# Patient Record
Sex: Male | Born: 1965 | Race: Black or African American | Hispanic: No | Marital: Married | Smoking: Never smoker
Health system: Southern US, Community
[De-identification: ages and names within clinical notes are randomized; demographics above are authoritative.]

---

## 2002-11-30 ENCOUNTER — Emergency Department (HOSPITAL_COMMUNITY): Admission: AD | Admit: 2002-11-30 | Discharge: 2002-11-30 | Payer: Self-pay | Admitting: Family Medicine

## 2003-09-11 ENCOUNTER — Emergency Department (HOSPITAL_COMMUNITY): Admission: EM | Admit: 2003-09-11 | Discharge: 2003-09-12 | Payer: Self-pay | Admitting: Emergency Medicine

## 2003-09-18 ENCOUNTER — Emergency Department (HOSPITAL_COMMUNITY): Admission: EM | Admit: 2003-09-18 | Discharge: 2003-09-18 | Payer: Self-pay | Admitting: Family Medicine

## 2005-01-17 ENCOUNTER — Emergency Department (HOSPITAL_COMMUNITY): Admission: EM | Admit: 2005-01-17 | Discharge: 2005-01-17 | Payer: Self-pay | Admitting: Emergency Medicine

## 2005-10-23 ENCOUNTER — Encounter: Admission: RE | Admit: 2005-10-23 | Discharge: 2005-10-23 | Payer: Self-pay | Admitting: Family Medicine

## 2006-04-02 ENCOUNTER — Encounter: Admission: RE | Admit: 2006-04-02 | Discharge: 2006-04-02 | Payer: Self-pay | Admitting: Family Medicine

## 2006-04-14 ENCOUNTER — Encounter: Admission: RE | Admit: 2006-04-14 | Discharge: 2006-04-14 | Payer: Self-pay | Admitting: Family Medicine

## 2007-02-13 ENCOUNTER — Emergency Department (HOSPITAL_COMMUNITY): Admission: EM | Admit: 2007-02-13 | Discharge: 2007-02-13 | Payer: Self-pay | Admitting: Emergency Medicine

## 2007-05-24 ENCOUNTER — Emergency Department (HOSPITAL_COMMUNITY): Admission: EM | Admit: 2007-05-24 | Discharge: 2007-05-24 | Payer: Self-pay | Admitting: Family Medicine

## 2008-08-02 ENCOUNTER — Emergency Department (HOSPITAL_COMMUNITY): Admission: EM | Admit: 2008-08-02 | Discharge: 2008-08-02 | Payer: Self-pay | Admitting: Family Medicine

## 2008-12-15 ENCOUNTER — Emergency Department (HOSPITAL_COMMUNITY): Admission: EM | Admit: 2008-12-15 | Discharge: 2008-12-15 | Payer: Self-pay | Admitting: Family Medicine

## 2009-11-10 ENCOUNTER — Emergency Department (HOSPITAL_COMMUNITY): Admission: EM | Admit: 2009-11-10 | Discharge: 2009-11-10 | Payer: Self-pay | Admitting: Family Medicine

## 2010-01-22 ENCOUNTER — Encounter: Payer: Self-pay | Admitting: Family Medicine

## 2010-03-14 LAB — POCT URINALYSIS DIPSTICK
Glucose, UA: NEGATIVE mg/dL
Ketones, ur: NEGATIVE mg/dL
Specific Gravity, Urine: 1.01 (ref 1.005–1.030)
Urobilinogen, UA: 0.2 mg/dL (ref 0.0–1.0)

## 2010-04-08 LAB — GC/CHLAMYDIA PROBE AMP, GENITAL: Chlamydia, DNA Probe: NEGATIVE

## 2010-04-08 LAB — HSV 1 ANTIBODY, IGG: HSV 1 Glycoprotein G Ab, IgG: 43.7 IV — ABNORMAL HIGH

## 2010-04-08 LAB — HIV ANTIBODY (ROUTINE TESTING W REFLEX): HIV: NONREACTIVE

## 2010-06-28 ENCOUNTER — Inpatient Hospital Stay (INDEPENDENT_AMBULATORY_CARE_PROVIDER_SITE_OTHER)
Admission: RE | Admit: 2010-06-28 | Discharge: 2010-06-28 | Disposition: A | Discharge status: Home / Self Care | Payer: Self-pay | Source: Ambulatory Visit | Attending: Family Medicine | Admitting: Family Medicine

## 2010-06-28 DIAGNOSIS — R300 Dysuria: Secondary | ICD-10-CM

## 2010-06-28 LAB — POCT URINALYSIS DIP (DEVICE)
Ketones, ur: NEGATIVE mg/dL
Leukocytes, UA: NEGATIVE
Protein, ur: NEGATIVE mg/dL
pH: 7 (ref 5.0–8.0)

## 2010-07-01 ENCOUNTER — Inpatient Hospital Stay (INDEPENDENT_AMBULATORY_CARE_PROVIDER_SITE_OTHER)
Admission: RE | Admit: 2010-07-01 | Discharge: 2010-07-01 | Disposition: A | Discharge status: Home / Self Care | Payer: Self-pay | Source: Ambulatory Visit | Attending: Family Medicine | Admitting: Family Medicine

## 2010-07-01 DIAGNOSIS — R071 Chest pain on breathing: Secondary | ICD-10-CM

## 2010-07-01 DIAGNOSIS — R300 Dysuria: Secondary | ICD-10-CM

## 2010-09-22 LAB — URINALYSIS, ROUTINE W REFLEX MICROSCOPIC
Glucose, UA: NEGATIVE
Hgb urine dipstick: NEGATIVE
Specific Gravity, Urine: 1.011
pH: 7

## 2010-09-22 LAB — DIFFERENTIAL
Basophils Absolute: 0
Eosinophils Absolute: 0
Eosinophils Relative: 0
Monocytes Absolute: 0.9

## 2010-09-22 LAB — I-STAT 8, (EC8 V) (CONVERTED LAB)
BUN: 6
Bicarbonate: 28.6 — ABNORMAL HIGH
Glucose, Bld: 105 — ABNORMAL HIGH
Potassium: 3.9
TCO2: 30
pH, Ven: 7.535 — ABNORMAL HIGH

## 2010-09-22 LAB — URINE CULTURE
Colony Count: NO GROWTH
Culture: NO GROWTH

## 2010-09-22 LAB — CBC
HCT: 45.2
MCV: 91.1
Platelets: 168
RDW: 12.6

## 2010-09-22 LAB — POCT I-STAT CREATININE: Operator id: 288331

## 2011-02-03 ENCOUNTER — Encounter (HOSPITAL_COMMUNITY): Payer: Self-pay | Admitting: Emergency Medicine

## 2011-02-03 ENCOUNTER — Emergency Department (INDEPENDENT_AMBULATORY_CARE_PROVIDER_SITE_OTHER)
Admission: EM | Admit: 2011-02-03 | Discharge: 2011-02-03 | Disposition: A | Discharge status: Home / Self Care | Payer: Self-pay | Source: Home / Self Care

## 2011-02-03 DIAGNOSIS — N342 Other urethritis: Secondary | ICD-10-CM

## 2011-02-03 MED ORDER — AZITHROMYCIN 250 MG PO TABS
1000.0000 mg | ORAL_TABLET | Freq: Once | ORAL | Status: AC
  Administered 2011-02-03: 1000 mg via ORAL

## 2011-02-03 MED ORDER — AZITHROMYCIN 250 MG PO TABS
ORAL_TABLET | ORAL | Status: AC
  Filled 2011-02-03: qty 4

## 2011-02-03 MED ORDER — CEFTRIAXONE SODIUM 250 MG IJ SOLR
250.0000 mg | Freq: Once | INTRAMUSCULAR | Status: AC
  Administered 2011-02-03: 250 mg via INTRAMUSCULAR

## 2011-02-03 MED ORDER — CEFTRIAXONE SODIUM 250 MG IJ SOLR
INTRAMUSCULAR | Status: AC
Start: 2011-02-03 — End: 2011-02-03
  Filled 2011-02-03: qty 250

## 2011-02-03 MED ORDER — LIDOCAINE HCL (PF) 1 % IJ SOLN
INTRAMUSCULAR | Status: AC
  Filled 2011-02-03: qty 5

## 2011-02-03 NOTE — ED Notes (Signed)
Verified  [phone] phone number

## 2011-02-03 NOTE — ED Notes (Signed)
Noticed penile tingling sensation 4 days ago, denies penile discharge.

## 2011-02-03 NOTE — ED Notes (Signed)
Offered graham crackers and coke while waiting for discharge.  Understands post injection delay prior to discharge

## 2011-02-03 NOTE — ED Provider Notes (Signed)
History     CSN: [id number]  Arrival date & time 02/03/11  1140   None     Chief Complaint  Patient presents with  . Penile Discharge    (Consider location/radiation/quality/duration/timing/severity/associated sxs/prior treatment) HPI Comments: Pt states he has burning discomfort with urination for 4 days. He had a "one night stand" recently and states the condom broke. He denies penile discharge.    History reviewed. No pertinent past medical history.  History reviewed. No pertinent past surgical history.  History reviewed. No pertinent family history.  History  Substance Use Topics  . Smoking status: Never Smoker   . Smokeless tobacco: Not on file  . Alcohol Use: No      Review of Systems  Constitutional: Negative for fever and chills.  Gastrointestinal: Negative for nausea, vomiting and abdominal pain.  Genitourinary: Positive for dysuria. Negative for frequency, discharge, penile swelling, scrotal swelling, penile pain and testicular pain.    Allergies  Review of patient's allergies indicates no known allergies.  Home Medications  No current outpatient prescriptions on file.  BP 128/80  Pulse 57  Temp(Src) 98 F (36.7 C) (Oral)  Resp 16  SpO2 99%  Physical Exam  Nursing note and vitals reviewed. Constitutional: He appears well-developed and well-nourished. No distress.  Cardiovascular: Normal rate, regular rhythm and normal heart sounds.   Pulmonary/Chest: Effort normal and breath sounds normal. No respiratory distress.  Genitourinary: Penis normal. Circumcised. No penile erythema. No discharge found.  Lymphadenopathy:       Right: No inguinal adenopathy present.       Left: No inguinal adenopathy present.  Skin: Skin is warm and dry.  Psychiatric: He has a normal mood and affect.    ED Course  Procedures (including critical care time)   Labs Reviewed  GC/CHLAMYDIA PROBE AMP, GENITAL   No results found.   1. Urethritis       MDM          Melody Comas, PA 02/03/11 1310

## 2011-02-04 ENCOUNTER — Telehealth (HOSPITAL_COMMUNITY): Payer: Self-pay | Admitting: Physician Assistant

## 2011-02-04 NOTE — ED Provider Notes (Signed)
Medical screening examination/treatment/procedure(s) were performed by non-physician practitioner and as supervising physician I was immediately available for consultation/collaboration.   MORENO-COLL,Gurnie Duris; MD   Anan Dapolito Moreno-Coll, MD 02/04/11 1242 

## 2011-02-04 NOTE — ED Notes (Signed)
Patient called today stating that he has had increased bowel movements since receiving medication since yesterday.  Patient made aware that the increase in bowel movements is normal given the amount of antibiotic he was given.  Patient made aware if it becomes loose or watery to taken immodium OTC.  Patient also made aware that the tingling in his penis would taken a couple of days to go away.  Pt expressed understanding.  While I was looking in patient's order history for the medications given I noticed order for [facility] had been discontinued.  I looked in lab system and the specimen was received in the lab.  I called the lab and spoke with [facility] and she said the specimen had been sent to [facility] [employer] and should show up when the results are finalized.  Provider made aware

## 2011-02-05 LAB — GC/CHLAMYDIA PROBE AMP, GENITAL: Chlamydia, DNA Probe: NEGATIVE

## 2011-02-06 ENCOUNTER — Telehealth (HOSPITAL_COMMUNITY): Payer: Self-pay | Admitting: *Deleted

## 2011-02-06 NOTE — ED Notes (Signed)
Discussed pt.'s concerns with Dr. Juanetta Gosling and order obtained for Flagyl 500 mg. PO BID x 7 days #14- No refills.  Pt. notified and wants Rx. called to [facility] on [address]. Rx. called to pharmacist @ [phone]. Vassie Moselle 02/06/2011

## 2011-06-15 ENCOUNTER — Encounter (HOSPITAL_BASED_OUTPATIENT_CLINIC_OR_DEPARTMENT_OTHER): Payer: Self-pay | Admitting: *Deleted

## 2011-06-15 ENCOUNTER — Emergency Department (HOSPITAL_BASED_OUTPATIENT_CLINIC_OR_DEPARTMENT_OTHER)
Admission: EM | Admit: 2011-06-15 | Discharge: 2011-06-15 | Disposition: A | Discharge status: Home / Self Care | Payer: Self-pay | Attending: Emergency Medicine | Admitting: Emergency Medicine

## 2011-06-15 DIAGNOSIS — Z202 Contact with and (suspected) exposure to infections with a predominantly sexual mode of transmission: Secondary | ICD-10-CM | POA: Insufficient documentation

## 2011-06-15 DIAGNOSIS — R300 Dysuria: Secondary | ICD-10-CM | POA: Insufficient documentation

## 2011-06-15 LAB — URINALYSIS, ROUTINE W REFLEX MICROSCOPIC
Leukocytes, UA: NEGATIVE
Nitrite: NEGATIVE
Specific Gravity, Urine: 1.021 (ref 1.005–1.030)
pH: 5.5 (ref 5.0–8.0)

## 2011-06-15 NOTE — ED Provider Notes (Signed)
History     CSN: [id number]  Arrival date & time 06/15/11  1357   First MD Initiated Contact with Patient 06/15/11 1405      Chief Complaint  Patient presents with  . Dysuria  . Exposure to STD    (Consider location/radiation/quality/duration/timing/severity/associated sxs/prior treatment) HPI Comments: Pt states that he has had tingling with urination:pt denies discharge but admits to unprotected sex:pt states that he saw his doctor as was given flagyl but not swabbed and the symptoms have continued  Patient is a 46 y.o. male presenting with dysuria. The history is provided by the patient. No language interpreter was used.  Dysuria  This is a new problem. The current episode started more than 1 week ago. The problem occurs every urination. The problem has not changed since onset.The quality of the pain is described as burning. The pain is mild. There has been no fever. He is sexually active. Pertinent negatives include no vomiting and no discharge.    History reviewed. No pertinent past medical history.  History reviewed. No pertinent past surgical history.  History reviewed. No pertinent family history.  History  Substance Use Topics  . Smoking status: Never Smoker   . Smokeless tobacco: Not on file  . Alcohol Use: No      Review of Systems  Constitutional: Negative.   Respiratory: Negative.   Cardiovascular: Negative.   Gastrointestinal: Negative for vomiting.  Genitourinary: Positive for dysuria.    Allergies  Review of patient's allergies indicates no known allergies.  Home Medications  No current outpatient prescriptions on file.  BP 141/81  Pulse 64  Temp 98.1 F (36.7 C) (Oral)  Resp 16  Ht 5\' 7"  (1.702 m)  Wt 150 lb (68.04 kg)  BMI 23.49 kg/m2  SpO2 100%  Physical Exam  Nursing note and vitals reviewed. Constitutional: He is oriented to person, place, and time. He appears well-developed and well-nourished.  Cardiovascular: Normal rate and  regular rhythm.   Pulmonary/Chest: Effort normal and breath sounds normal.  Genitourinary:       No discharge noted  Musculoskeletal: Normal range of motion.  Neurological: He is alert and oriented to person, place, and time.  Skin: Skin is warm and dry.    ED Course  Procedures (including critical care time)   Labs Reviewed  URINALYSIS, ROUTINE W REFLEX MICROSCOPIC  GC/CHLAMYDIA PROBE AMP, GENITAL   No results found.   1. Dysuria       MDM  No infection noted in urine:std culture sent:based on exam don't think pt needs std culture at this time        Teressa Lower, NP 06/15/11 1511

## 2011-06-15 NOTE — Discharge Instructions (Signed)

## 2011-06-15 NOTE — ED Notes (Signed)
Pt c/o painful urination x 2 weeks denies discharge, admits to unprotected sex

## 2011-06-15 NOTE — ED Provider Notes (Signed)
Medical screening examination/treatment/procedure(s) were performed by non-physician practitioner and as supervising physician I was immediately available for consultation/collaboration.   Sierria Bruney, MD 06/15/11 1556 

## 2011-06-16 LAB — GC/CHLAMYDIA PROBE AMP, GENITAL
Chlamydia, DNA Probe: NEGATIVE
GC Probe Amp, Genital: NEGATIVE

## 2014-07-15 ENCOUNTER — Encounter (HOSPITAL_COMMUNITY): Payer: Self-pay | Admitting: *Deleted

## 2014-07-15 ENCOUNTER — Emergency Department (HOSPITAL_COMMUNITY)
Admission: EM | Admit: 2014-07-15 | Discharge: 2014-07-15 | Disposition: A | Discharge status: Home / Self Care | Payer: Self-pay | Source: Home / Self Care | Attending: Family Medicine | Admitting: Family Medicine

## 2014-07-15 ENCOUNTER — Other Ambulatory Visit (HOSPITAL_COMMUNITY)
Admission: RE | Admit: 2014-07-15 | Discharge: 2014-07-15 | Disposition: A | Discharge status: Home / Self Care | Payer: Self-pay | Source: Ambulatory Visit | Attending: Family Medicine | Admitting: Family Medicine

## 2014-07-15 DIAGNOSIS — Z113 Encounter for screening for infections with a predominantly sexual mode of transmission: Secondary | ICD-10-CM | POA: Insufficient documentation

## 2014-07-15 DIAGNOSIS — N342 Other urethritis: Secondary | ICD-10-CM

## 2014-07-15 MED ORDER — METRONIDAZOLE 500 MG PO TABS: 500.0000 mg | ORAL_TABLET | Freq: Two times a day (BID) | ORAL | Status: DC

## 2014-07-15 MED ORDER — AZITHROMYCIN 250 MG PO TABS
ORAL_TABLET | ORAL | Status: AC
  Filled 2014-07-15: qty 4

## 2014-07-15 MED ORDER — CEFTRIAXONE SODIUM 250 MG IJ SOLR
INTRAMUSCULAR | Status: AC
  Filled 2014-07-15: qty 250

## 2014-07-15 MED ORDER — CEFTRIAXONE SODIUM 250 MG IJ SOLR
250.0000 mg | Freq: Once | INTRAMUSCULAR | Status: AC
  Administered 2014-07-15: 250 mg via INTRAMUSCULAR

## 2014-07-15 MED ORDER — AZITHROMYCIN 250 MG PO TABS
1000.0000 mg | ORAL_TABLET | Freq: Once | ORAL | Status: AC
  Administered 2014-07-15: 1000 mg via ORAL

## 2014-07-15 NOTE — Discharge Instructions (Signed)
Urethritis °Urethritis is an inflammation of the tube through which urine exits your bladder (urethra).  °CAUSES °Urethritis is often caused by an infection in your urethra. The infection can be viral, like herpes. The infection can also be bacterial, like gonorrhea. °RISK FACTORS °Risk factors of urethritis include: °· Having sex without using a condom. °· Having multiple sexual partners. °· Having poor hygiene. °SIGNS AND SYMPTOMS °Symptoms of urethritis are less noticeable in women than in men. These symptoms include: °· Burning feeling when you urinate (dysuria). °· Discharge from your urethra. °· Blood in your urine (hematuria). °· Urinating more than usual. °DIAGNOSIS  °To confirm a diagnosis of urethritis, your health care provider will do the following: °· Ask about your sexual history. °· Perform a physical exam. °· Have you provide a sample of your urine for lab testing. °· Use a cotton swab to gently collect a sample from your urethra for lab testing. °TREATMENT  °It is important to treat urethritis. Depending on the cause, untreated urethritis may lead to serious genital infections and possibly infertility. Urethritis caused by a bacterial infection is treated with antibiotic medicine. All sexual partners must be treated.  °HOME CARE INSTRUCTIONS °· Do not have sex until the test results are known and treatment is completed, even if your symptoms go away before you finish treatment. °· If you were prescribed an antibiotic, finish it all even if you start to feel better. °SEEK MEDICAL CARE IF:  °· Your symptoms are not improved in 3 days. °· Your symptoms are getting worse. °· You develop abdominal pain or pelvic pain (in women). °· You develop joint pain. °· You have a fever. °SEEK IMMEDIATE MEDICAL CARE IF:  °· You have severe pain in the belly, back, or side. °· You have repeated vomiting. °MAKE SURE YOU: °· Understand these instructions. °· Will watch your condition. °· Will get help right away if you  are not doing well or get worse. °Document Released: 06/13/2000 Document Revised: 05/04/2013 Document Reviewed: 08/18/2012 °ExitCare® Patient Information ©2015 [employer]. This information is not intended to replace advice given to you by your health care provider. Make sure you discuss any questions you have with your health care provider. ° °

## 2014-07-15 NOTE — ED Notes (Signed)
Pt  States  He  Recently  Had  Unprotected   Sex       -  He  Reports  A  Burning /  Tingling   Sensation            Symptoms   X   5  Days    He  Is sitting  Upright on  The  Exam table  Speaking  In   Complete  sentances

## 2014-07-15 NOTE — ED Provider Notes (Signed)
CSN: [id number]     Arrival date & time 07/15/14  1505 History   First MD Initiated Contact with Patient 07/15/14 1702     Chief Complaint  Patient presents with  . SEXUALLY TRANSMITTED DISEASE   (Consider location/radiation/quality/duration/timing/severity/associated sxs/prior Treatment) Patient is a 49 y.o. male presenting with penile discharge. The history is provided by the patient. No language interpreter was used.  Penile Discharge This is a new problem. The current episode started more than 2 days ago. The problem occurs constantly. The problem has been gradually worsening. Nothing aggravates the symptoms. Nothing relieves the symptoms. He has tried nothing for the symptoms.    No past medical history on file. No past surgical history on file. No family history on file. History  Substance Use Topics  . Smoking status: Never Smoker   . Smokeless tobacco: Not on file  . Alcohol Use: No    Review of Systems  Genitourinary: Positive for discharge.  All other systems reviewed and are negative.   Allergies  Review of patient's allergies indicates no known allergies.  Home Medications   Prior to Admission medications   Not on File   There were no vitals taken for this visit. Physical Exam  Constitutional: He is oriented to person, place, and time. He appears well-developed and well-nourished.  HENT:  Head: Normocephalic.  Eyes: EOM are normal.  Neck: Normal range of motion.  Pulmonary/Chest: Effort normal.  Abdominal: He exhibits no distension.  Musculoskeletal: Normal range of motion.  Neurological: He is alert and oriented to person, place, and time.  Psychiatric: He has a normal mood and affect.  Nursing note and vitals reviewed.   ED Course  Procedures (including critical care time) Labs Review Labs Reviewed  URINE CYTOLOGY ANCILLARY ONLY    Imaging Review No results found.  Pt given rocephin IM. Zithromax po MDM gc and ct pending   1. Urethritis     Rocephin Zithromax AVS    Elson AreasLeslie K Gailene Youkhana, PA-C 07/15/14 1739  Lonia SkinnerLeslie K PoynorSofia, PA-C 07/15/14 1739

## 2014-07-16 LAB — URINE CYTOLOGY ANCILLARY ONLY
Chlamydia: NEGATIVE
Neisseria Gonorrhea: NEGATIVE

## 2014-07-16 NOTE — ED Notes (Signed)
Final report STD screening negative; no further action required

## 2014-07-23 ENCOUNTER — Encounter (HOSPITAL_COMMUNITY): Payer: Self-pay | Admitting: Emergency Medicine

## 2014-07-23 ENCOUNTER — Emergency Department (INDEPENDENT_AMBULATORY_CARE_PROVIDER_SITE_OTHER)
Admission: EM | Admit: 2014-07-23 | Discharge: 2014-07-23 | Disposition: A | Discharge status: Home / Self Care | Payer: Self-pay | Source: Home / Self Care | Attending: Family Medicine | Admitting: Family Medicine

## 2014-07-23 DIAGNOSIS — N341 Nonspecific urethritis: Secondary | ICD-10-CM

## 2014-07-23 MED ORDER — DOXYCYCLINE HYCLATE 100 MG PO CAPS: 100.0000 mg | ORAL_CAPSULE | Freq: Two times a day (BID) | ORAL | Status: DC

## 2014-07-23 NOTE — ED Notes (Signed)
Pt returns today with same sx's from last visit 07/15/14 Pt was seen and treated for Urethritis with Zithromax/ Rocephin with negative screening States he completed prescribed Flagyl Wednesday; penile tingling sensation returned Sunday  Denies pain or hematuria

## 2014-07-23 NOTE — ED Provider Notes (Signed)
CSN: [id number]     Arrival date & time 07/23/14  1301 History   First MD Initiated Contact with Patient 07/23/14 1310     Chief Complaint  Patient presents with  . SEXUALLY TRANSMITTED DISEASE   (Consider location/radiation/quality/duration/timing/severity/associated sxs/prior Treatment) Patient is a 49 y.o. male presenting with dysuria. The history is provided by the patient.  Dysuria This is a recurrent problem. The current episode started more than 1 week ago. The problem has been gradually worsening. Associated symptoms comments: Seen 7/14 with neg results of std screen, was given meds and sx improved for 2d, then recurred on sun and cont with tingling, no skin changes, no d/c.Marland Kitchen    History reviewed. No pertinent past medical history. History reviewed. No pertinent past surgical history. No family history on file. History  Substance Use Topics  . Smoking status: Never Smoker   . Smokeless tobacco: Not on file  . Alcohol Use: No    Review of Systems  Constitutional: Negative.   Gastrointestinal: Negative.   Genitourinary: Positive for dysuria. Negative for urgency, hematuria, discharge, penile swelling, scrotal swelling, penile pain and testicular pain.    Allergies  Review of patient's allergies indicates no known allergies.  Home Medications   Prior to Admission medications   Medication Sig Start Date End Date Taking? Authorizing Provider  metroNIDAZOLE (FLAGYL) 500 MG tablet Take 1 tablet (500 mg total) by mouth 2 (two) times daily. 07/15/14  Yes Lonia Skinner Sofia, PA-C  doxycycline (VIBRAMYCIN) 100 MG capsule Take 1 capsule (100 mg total) by mouth 2 (two) times daily. 07/23/14   Linna Hoff, MD   BP 134/81 mmHg  Pulse 68  Temp(Src) 98.2 F (36.8 C) (Oral)  Resp 16  SpO2 97% Physical Exam  Constitutional: He is oriented to person, place, and time. He appears well-developed and well-nourished. No distress.  Abdominal: Soft. Bowel sounds are normal.  Neurological: He  is alert and oriented to person, place, and time.  Skin: Skin is warm and dry.  Nursing note and vitals reviewed.   ED Course  Procedures (including critical care time) Labs Review Labs Reviewed - No data to display  Imaging Review No results found.   MDM   1. Nonspecific urethritis        Linna Hoff, MD 07/23/14 1327

## 2015-03-25 ENCOUNTER — Encounter (HOSPITAL_COMMUNITY): Payer: Self-pay | Admitting: Emergency Medicine

## 2015-03-25 ENCOUNTER — Emergency Department (INDEPENDENT_AMBULATORY_CARE_PROVIDER_SITE_OTHER)
Admission: EM | Admit: 2015-03-25 | Discharge: 2015-03-25 | Disposition: A | Discharge status: Home / Self Care | Payer: Self-pay | Source: Home / Self Care | Attending: Family Medicine | Admitting: Family Medicine

## 2015-03-25 DIAGNOSIS — Z23 Encounter for immunization: Secondary | ICD-10-CM

## 2015-03-25 DIAGNOSIS — S61012A Laceration without foreign body of left thumb without damage to nail, initial encounter: Secondary | ICD-10-CM

## 2015-03-25 MED ORDER — TETANUS-DIPHTH-ACELL PERTUSSIS 5-2.5-18.5 LF-MCG/0.5 IM SUSP
INTRAMUSCULAR | Status: AC
  Filled 2015-03-25: qty 0.5

## 2015-03-25 MED ORDER — TETANUS-DIPHTH-ACELL PERTUSSIS 5-2.5-18.5 LF-MCG/0.5 IM SUSP
0.5000 mL | Freq: Once | INTRAMUSCULAR | Status: AC
  Administered 2015-03-25: 0.5 mL via INTRAMUSCULAR

## 2015-03-25 NOTE — ED Provider Notes (Signed)
CSN: [id number]     Arrival date & time 03/25/15  1340 History   First MD Initiated Contact with Patient 03/25/15 1528     Chief Complaint  Patient presents with  . Extremity Laceration   (Consider location/radiation/quality/duration/timing/severity/associated sxs/prior Treatment) The history is provided by the patient. No language interpreter was used.   Patient here for complaint of pain associated with laceration of left thumb with kitchen knife at 1:30pm today, was cutting bacon and knife slipped and cut tip of thumb.  Bleeding easily controlled with paper towel.  [facility]   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  History reviewed. No pertinent past medical history. History reviewed. No pertinent past surgical history. No family history on file. Social History  Substance Use Topics  . Smoking status: Never Smoker   . Smokeless tobacco: None  . Alcohol Use: No    Review of Systems  Constitutional: Negative for fever, diaphoresis, activity change and fatigue.  All other systems reviewed and are negative.   Allergies  Review of patient's allergies indicates no known allergies.  Home Medications   Prior to Admission medications   Medication Sig Start Date End Date Taking? Authorizing Provider  doxycycline (VIBRAMYCIN) 100 MG capsule Take 1 capsule (100 mg total) by mouth 2 (two) times daily. 07/23/14   Linna HoffJames D Kindl, MD  metroNIDAZOLE (FLAGYL) 500 MG tablet Take 1 tablet (500 mg total) by mouth 2 (two) times daily. 07/15/14   Elson AreasLeslie K Sofia, PA-C   Meds Ordered and Administered this Visit   Medications  Tdap (BOOSTRIX) injection 0.5 mL (not administered)    BP 126/81 mmHg  Pulse 60  Temp(Src) 97.7 F (36.5 C) (Oral)  SpO2 98% No data found.   Physical Exam  Constitutional: He appears well-developed and well-nourished. No distress.  Musculoskeletal:  Left hand soaked in Betadine soak.   Distal tip of L thumb with well-approximated laceration  measuring less than 1cm.  Brisk cap refill < 2seconds in tip of digit. No bleeding noted. No evidence of dirt or FB.  Palpable radial pulse in L wrist. Full flexion/extension of the L thumb and other digits of the L hand.   Skin: He is not diaphoretic.    ED Course  Procedures (including critical care time)  Labs Review Labs Reviewed - No data to display  Imaging Review No results found.   Visual Acuity Review  Right Eye Distance:   Left Eye Distance:   Bilateral Distance:    Right Eye Near:   Left Eye Near:    Bilateral Near:         MDM   1. Thumb laceration, left, initial encounter    Laceration of L thumb, well approximated and no evidence of foreign body. Allowed to soak; wound appears clean.  Steri strips placed to maintain good approximation of the apposing skin. To keep dry for the coming 24 hours, to keep bandaged while working.   Tdap today, patient estimates 10 years since last tetanus booster.     Barbaraann BarthelJames O Jayquan Bradsher, MD 03/25/15 [phone]

## 2015-03-25 NOTE — ED Notes (Signed)
Patient c/o left thumb laceration onset today about 1:30 pm. Patient reports he was cutting up bacon and sliced his thumb with a kitchen knife. Bleeding is controlled. Patient has not had a tetanus shot in about ten years.

## 2015-03-25 NOTE — Discharge Instructions (Signed)
It is a pleasure to see you today for the laceration of your left thumb.   Steri strips were placed to hold the skin in place. Keep dry for at least the next 24 hours.  Then, keep covered when you are working.   Tetanus booster today in the Urgent Care Center.   Follow up with your primary doctor or the Urgent Care Center if you develop worsening redness, swelling, if pus comes from the wound, or with other problems or concerns.

## 2015-03-25 NOTE — ED Notes (Signed)
Patient placed in Betadine soak.

## 2015-04-15 ENCOUNTER — Ambulatory Visit (INDEPENDENT_AMBULATORY_CARE_PROVIDER_SITE_OTHER): Payer: Self-pay

## 2015-04-15 ENCOUNTER — Ambulatory Visit (HOSPITAL_COMMUNITY)
Admission: EM | Admit: 2015-04-15 | Discharge: 2015-04-15 | Disposition: A | Discharge status: Home / Self Care | Payer: Self-pay

## 2015-04-15 ENCOUNTER — Ambulatory Visit (HOSPITAL_COMMUNITY): Payer: Self-pay

## 2015-04-15 ENCOUNTER — Ambulatory Visit (HOSPITAL_COMMUNITY)
Admission: EM | Admit: 2015-04-15 | Discharge: 2015-04-15 | Disposition: A | Discharge status: Home / Self Care | Payer: Self-pay | Attending: Emergency Medicine | Admitting: Emergency Medicine

## 2015-04-15 ENCOUNTER — Encounter (HOSPITAL_COMMUNITY): Payer: Self-pay

## 2015-04-15 DIAGNOSIS — J40 Bronchitis, not specified as acute or chronic: Secondary | ICD-10-CM

## 2015-04-15 DIAGNOSIS — H109 Unspecified conjunctivitis: Secondary | ICD-10-CM

## 2015-04-15 MED ORDER — POLYMYXIN B-TRIMETHOPRIM 10000-0.1 UNIT/ML-% OP SOLN: 1.0000 [drp] | Freq: Four times a day (QID) | OPHTHALMIC | Status: DC

## 2015-04-15 MED ORDER — AZITHROMYCIN 250 MG PO TABS: ORAL_TABLET | ORAL | Status: DC

## 2015-04-15 MED ORDER — PREDNISONE 50 MG PO TABS: ORAL_TABLET | ORAL | Status: DC

## 2015-04-15 NOTE — ED Provider Notes (Addendum)
CSN: [id number]     Arrival date & time 04/15/15  1741 History   None    Chief Complaint  Patient presents with  . Cold Exposure   (Consider location/radiation/quality/duration/timing/severity/associated sxs/prior Treatment) HPI  He is a 50 year old man here for evaluation of cold symptoms. He states his symptoms started 4 days ago with nasal congestion, sore throat, and cough. He does report some hot and cold chills on the first day, but none since then. The cough was initially productive of brown sputum that turned to clear and it is now nonproductive. No headaches or body aches. No nausea or vomiting. He has tried NyQuil and Alka-Seltzer with minimal improvement. No wheezing or shortness of breath.  History reviewed. No pertinent past medical history. History reviewed. No pertinent past surgical history. No family history on file. Social History  Substance Use Topics  . Smoking status: Never Smoker   . Smokeless tobacco: Never Used  . Alcohol Use: No    Review of Systems As in history of present illness Allergies  Review of patient's allergies indicates no known allergies.  Home Medications   Prior to Admission medications   Medication Sig Start Date End Date Taking? Authorizing Provider  azithromycin (ZITHROMAX Z-PAK) 250 MG tablet Take 2 pills today, then 1 pill daily until gone. 04/15/15   Charm RingsErin J Honig, MD  predniSONE (DELTASONE) 50 MG tablet Take 1 pill daily for 5 days. 04/15/15   Charm RingsErin J Honig, MD  trimethoprim-polymyxin b (POLYTRIM) ophthalmic solution Place 1 drop into the left eye 4 (four) times daily. For 5 days 04/15/15   Charm RingsErin J Honig, MD   Meds Ordered and Administered this Visit  Medications - No data to display  BP 111/60 mmHg  Pulse 66  Temp(Src) 98.2 F (36.8 C) (Oral)  SpO2 100% No data found.   Physical Exam  Constitutional: He is oriented to person, place, and time. He appears well-developed and well-nourished. No distress.  HENT:  Mouth/Throat: No  oropharyngeal exudate.  Mild edema of the nasal mucosa. Some streaky erythema of the oropharynx. TMs normal bilaterally.  Neck: Neck supple.  Cardiovascular: Normal rate, regular rhythm and normal heart sounds.   No murmur heard. Pulmonary/Chest: Effort normal. No respiratory distress. He has no wheezes. He has rales (in right lower lobe).  Lymphadenopathy:    He has no cervical adenopathy.  Neurological: He is alert and oriented to person, place, and time.    ED Course  Procedures (including critical care time)  Labs Review Labs Reviewed - No data to display  Imaging Review Dg Chest 2 View  04/15/2015  CLINICAL DATA:  Upper respiratory infection for the past 5 days. EXAM: CHEST  2 VIEW COMPARISON:  02/13/2007. FINDINGS: Normal sized heart. Clear lungs. Mild scoliosis. Minimal thoracic spine degenerative changes. IMPRESSION: No acute abnormality. Electronically Signed   By: Beckie SaltsSteven  Reid M.D.   On: 04/15/2015 19:51      MDM   1. Bronchitis   2. Conjunctivitis of left eye    Treat with prednisone and azithromycin. Polytrim for conjunctivitis. Return precautions reviewed.  I have verbally reviewed the discharge instructions with the patient. A printed AVS was given to the patient.  All questions were answered prior to discharge.    Charm RingsErin J Honig, MD 04/15/15 [id number]  Charm RingsErin J Honig, MD 04/15/15 2031

## 2015-04-15 NOTE — ED Notes (Signed)
Arline AspCindy B reports patient said "too many people in here" and with that , he left

## 2015-04-15 NOTE — ED Notes (Signed)
Patient presents with cold symptoms x5 days and red left eye that he states was crusted over when he woke up this morning. Patient has taken alka seltzer cold plus tabs it was last taken on last night 04/14/2015 at 10:00pm. No acute distress

## 2015-04-15 NOTE — Discharge Instructions (Signed)
You have bronchitis. Take azithromycin and prednisone as prescribed. Use the eye drops 4 times a day for 5 days. You should see improvement in the next 3-5 days. If you develop fevers, difficulty breathing, or are just not getting better, please come back or go to the emergency room.

## 2015-04-15 NOTE — ED Notes (Signed)
Patient discharged by Randal BubaErin Honig, MD

## 2016-05-10 ENCOUNTER — Emergency Department
Admission: EM | Admit: 2016-05-10 | Discharge: 2016-05-10 | Disposition: A | Discharge status: Home / Self Care | Payer: Self-pay | Attending: Emergency Medicine | Admitting: Emergency Medicine

## 2016-05-10 ENCOUNTER — Emergency Department: Payer: Self-pay

## 2016-05-10 DIAGNOSIS — N342 Other urethritis: Secondary | ICD-10-CM | POA: Insufficient documentation

## 2016-05-10 LAB — URINALYSIS, REFLEX TO MICROSCOPIC EXAM IF INDICATED
Bilirubin, UA: NEGATIVE
Blood, UA: NEGATIVE
Glucose, UA: NEGATIVE
Ketones UA: NEGATIVE
Nitrite, UA: NEGATIVE
Protein, UR: 30 — AB
Specific Gravity UA: 1.025 (ref 1.001–1.035)
Urine pH: 6 (ref 5.0–8.0)
Urobilinogen, UA: NORMAL mg/dL

## 2016-05-10 MED ORDER — AZITHROMYCIN 250 MG PO TABS
1000.0000 mg | ORAL_TABLET | Freq: Once | ORAL | Status: AC
Start: 2016-05-10 — End: 2016-05-10
  Administered 2016-05-10: 08:00:00 1000 mg via ORAL

## 2016-05-10 MED ORDER — CEFTRIAXONE SODIUM 250 MG IJ SOLR
250.0000 mg | Freq: Once | INTRAMUSCULAR | Status: AC
Start: 2016-05-10 — End: 2016-05-10
  Administered 2016-05-10: 08:00:00 250 mg via INTRAMUSCULAR
  Filled 2016-05-10: qty 250

## 2016-05-10 MED ORDER — LIDOCAINE HCL (PF) 1 % IJ SOLN
INTRAMUSCULAR | Status: AC
Start: 2016-05-10 — End: 2016-05-10
  Administered 2016-05-10: 08:00:00 0.9 mL via INTRAMUSCULAR
  Filled 2016-05-10: qty 30

## 2016-05-10 MED ORDER — LIDOCAINE HCL 1 % IJ SOLN
0.9000 mL | Freq: Once | INTRAMUSCULAR | Status: AC
Start: 2016-05-10 — End: 2016-05-10

## 2016-05-10 MED ORDER — AZITHROMYCIN 250 MG PO TABS
500.0000 mg | ORAL_TABLET | Freq: Once | ORAL | Status: DC
Start: 2016-05-10 — End: 2016-05-10
  Filled 2016-05-10: qty 4

## 2016-05-10 NOTE — ED Triage Notes (Signed)
Omar Velez is a 51 y.o. male presented from home with reports of burning during urination after anal sex with his wife who he is in a monogamous relationship with. Pt reports performing anal sex with out a condom. Pt reports wife has similar urinary symptoms and he had vaginal intercourse with her after anal sex.

## 2016-05-10 NOTE — ED Provider Notes (Signed)
EMERGENCY DEPARTMENT HISTORY AND PHYSICAL EXAM     Physician/Midlevel provider first contact with patient: 05/10/16 0744         Date: 05/10/2016  Patient Name: Omar Velez    History of Presenting Illness     Chief Complaint   Patient presents with   . Dysuria       History Provided By: Patient    Chief Complaint: Tingling with urination  Duration: 2 days  Timing:  Intermittent  Location: Penile  Quality: Tingling  Severity: Mild  Exacerbating factors: Anal intercourse with wife, immediately followed with vaginal intercourse without a condom  Alleviating factors: None  Associated Symptoms: None  Pertinent Negatives: Denies dysuria, HA, fever, groin swelling, groin lesions, discharge, sexual intercourse with anyone other than spouse, or history of STDs    Additional History: Omar Velez is a 51 y.o. male with no pertinent medical history presenting to the ED with mild, intermittent tingling with urination x 2 days to the penile area. He notes that he experiences tingling, but no burning or discharge. Of note, pt states that he had anal intercourse, immediately followed with vaginal intercourse with his wife 3 days ago without a condom. He states that his wife is experiencing similar sxs.   Denies dysuria, HA, fever, groin swelling, groin lesions, discharge, sexual intercourse with anyone other than spouse, or history of STDs      No fever, flank tenderness   PCP: Pcp, Noneorunknown, MD  SPECIALISTS:    No current facility-administered medications for this encounter.      No current outpatient prescriptions on file.       Past History     Past Medical History:  History reviewed. No pertinent past medical history.    Past Surgical History:  History reviewed. No pertinent surgical history.    Family History:  History reviewed. No pertinent family history.    Social History:  Social History   Substance Use Topics   . Smoking status: Never Smoker   . Smokeless tobacco: Never Used   . Alcohol use No       Allergies:  No  Known Allergies    Review of Systems   Review of Systems   Constitutional: Negative for fever.   Eyes: Negative for redness.   Respiratory: Negative for cough.    Gastrointestinal: Negative for abdominal pain and nausea.   Genitourinary: Positive for difficulty urinating. Negative for discharge, dysuria, penile swelling and scrotal swelling.        (-) Groin lesions   Musculoskeletal: Negative for back pain and joint swelling.   Skin: Negative for rash.   Allergic/Immunologic:        NKDA   Neurological: Negative for headaches.   Psychiatric/Behavioral: Negative for suicidal ideas.       Physical Exam   BP (!) 172/100   Pulse 63   Temp 98 F (36.7 C) (Oral)   Resp 18   Ht 5\' 7"  (1.702 m)   Wt 77.1 kg   SpO2 99%   BMI 26.63 kg/m   Physical Exam   Constitutional: Patient is oriented to person, place, and time and well-developed, well-nourished, and in no distress.   Head: Normocephalic and atraumatic.   Eyes: EOM are normal. Pupils are equal, round, and reactive to light.   ENT: nares: nl mucosa, no discharge, ears: nl external canals, TMs, NLRB, Pharynx: tonsils nl, uvula midline, no lesions  Neck: Normal range of motion. Neck supple. No anterior cervical LNs.  Cardiovascular: Normal rate  and regular rhythm.   Pulmonary/Chest: Effort normal and breath sounds normal. No respiratory distress.   Abdominal: Soft. There is no tenderness. Bowel sounds present and normal.No CVA tenderness  Musculoskeletal: Normal range of motion.   Neurological: Patient is alert and oriented to person, place, and time. GCS score is 15. No sensory deficits, No motor weakness, no facial weakness, no dysarthria, No upper extremity drift, DTRs 2+,   Skin: Skin is warm and dry.   Genitourinary: No penile or urethral meatal lesions. No penile or genital lesions. No inguinal lymphadenopathy. No testicular tenderness.           Diagnostic Study Results     Labs -     Results     ** No results found for the last 24 hours. **           Radiologic Studies -   Radiology Results (24 Hour)     ** No results found for the last 24 hours. **      .    Medical Decision Making   I am the first provider for this patient.    I reviewed the vital signs, available nursing notes, past medical history, past surgical history, family history and social history.    Vital Signs-Reviewed the patient's vital signs.     Patient Vitals for the past 12 hrs:   BP Temp Pulse Resp   05/10/16 0744 (!) 172/100 98 F (36.7 C) 63 18       Pulse Oximetry Analysis - Normal 98% on RA    Old Medical Records: Nursing notes.     ED Course:     7:51 AM - Pt evaluated and agreeable with treatment plan, including having labs performed.     8:03 AM - Pt understands that wife must visit her Gynecologist and pt must refrain from having sexual intercourse with wife until her symptoms are improved as well.       Provider Notes:     Diagnosis     Clinical Impression:   1. Urethritis        Treatment Plan:   ED Disposition     None            _______________________________      Attestations: This note is prepared by Graylin Shiver, acting as scribe for Docia Chuck, MD.    Docia Chuck, MD - The scribe's documentation has been prepared under my direction and personally reviewed by me in its entirety.  I confirm that the note above accurately reflects all work, treatment, procedures, and medical decision making performed by me.    _______________________________       Laren Boom, MD  05/10/16 1325

## 2016-05-10 NOTE — Discharge Instructions (Signed)
Urethritis    You have been diagnosed with urethritis.    This is an infection of the urethra (the tube that passes urine out of the penis). It is an infection in men and is usually sexually transmitted. This means that it is spread by having sex with someone who is infected. Symptoms include burning with urination (peeing) and the urge to urinate often but only making a small amount of urine each time. Often there is "pus-like" discharge (drainage) from the penis.    Treatment is with antibiotics. Anyone you have had sex with should be checked for infection and treated if necessary.    Urethritis usually gets better over a few days. Seek medical attention if your symptoms get worse. Also seek medical attention if your symptoms do not get better with treatment given.    It is important to not have sexual intercourse (sex) until your infection has been fully treated. This way, you don't spread the infection to your partner.    Most cases are caused by Gonorrhea or Chlamydia. It is possible, however, that you could have other sexually transmitted infections that were not tested for today. These include HIV (Human Immunodeficiency Virus) and Syphilis. It is important to follow up with your doctor or local health department for more tests.    YOU SHOULD SEEK MEDICAL ATTENTION IMMEDIATELY, EITHER HERE OR AT THE NEAREST EMERGENCY DEPARTMENT, IF ANY OF THE FOLLOWING OCCURS:   More pain or swelling or continued discharge.   Unusual skin rashes.   Testicular pain or swelling, swelling of the scrotum.    Of note, ask wife to get checked with Gynecologist for similar symptoms. Please refrain from sexual intercourse until wife's symptoms are treated as well.

## 2016-05-14 NOTE — ED Notes (Signed)
Patient called to inquire about STD results, only urine culture results listed for patient. Advised patient he may return for STD tests or f/u with local health department STD clinic.

## 2017-01-18 IMAGING — DX DG CHEST 2V
2 series · 2 of 2 positions shown · non-contrast
Comparison: 02/13/2007.

CLINICAL DATA: Upper respiratory infection for the past 5 days.

EXAM:
CHEST  2 VIEW

[chest pa]
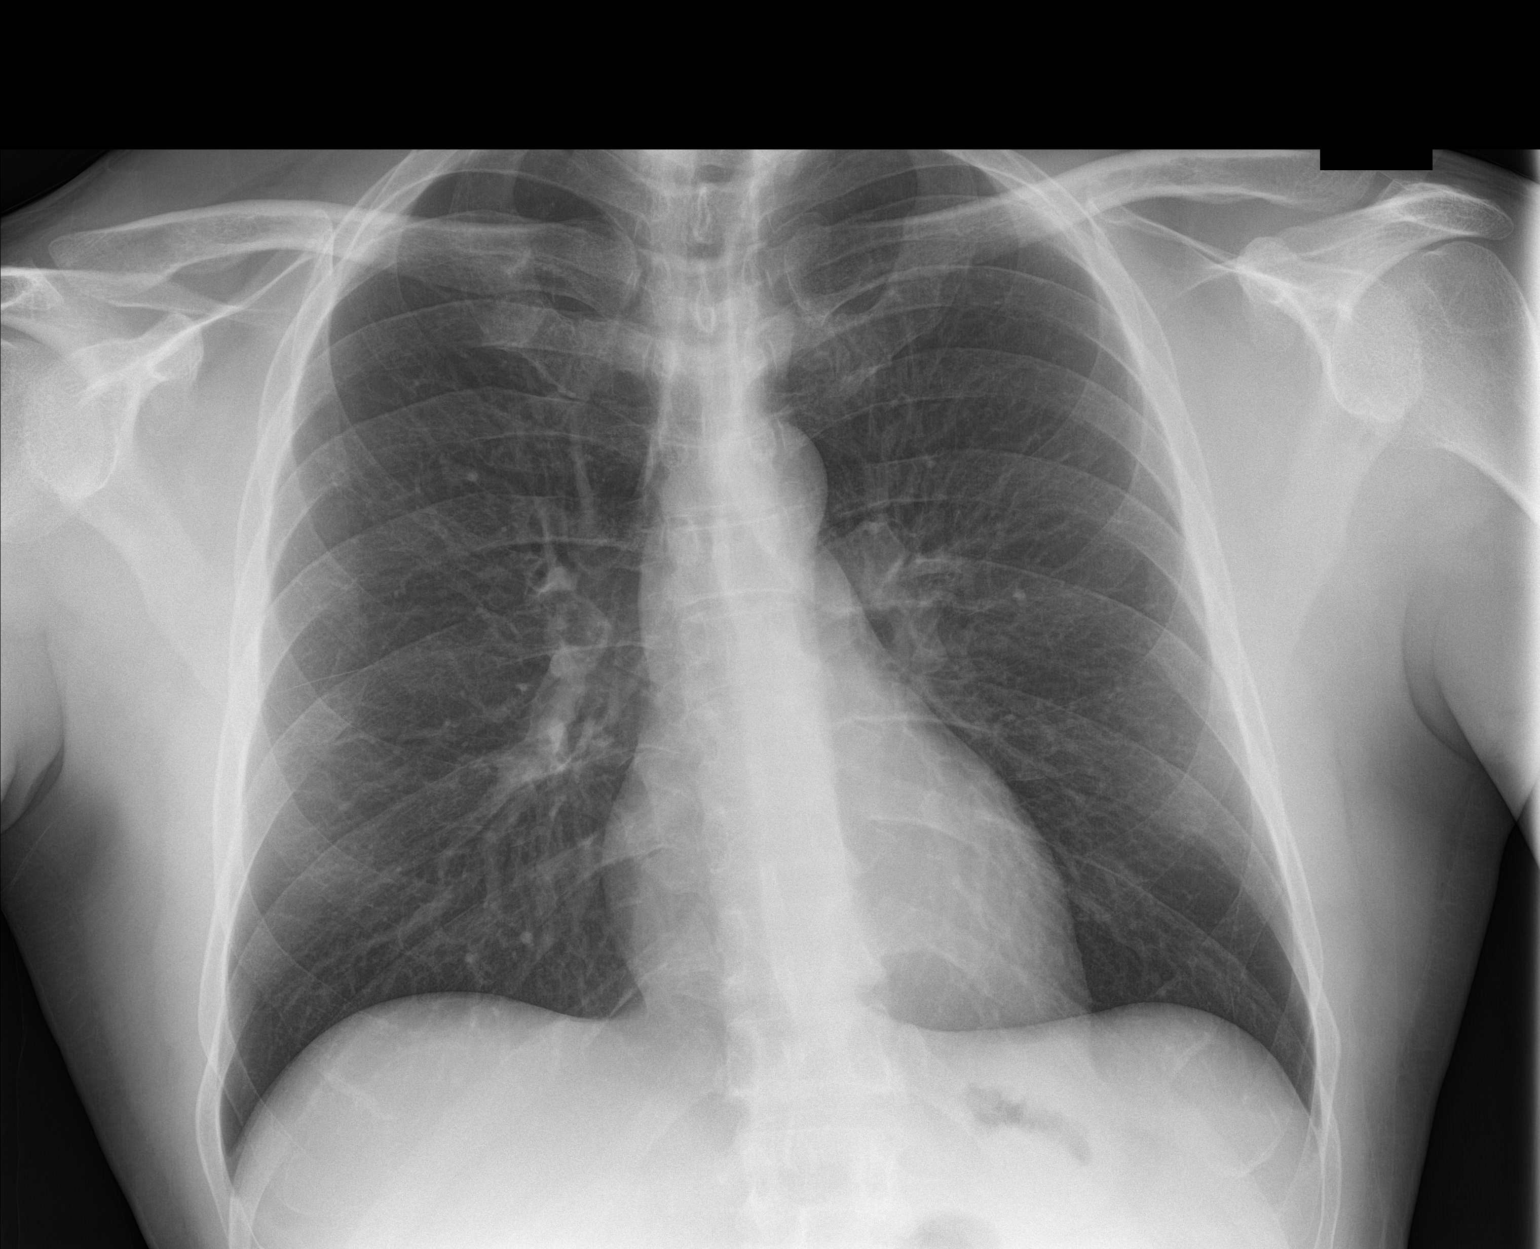

[chest lat]
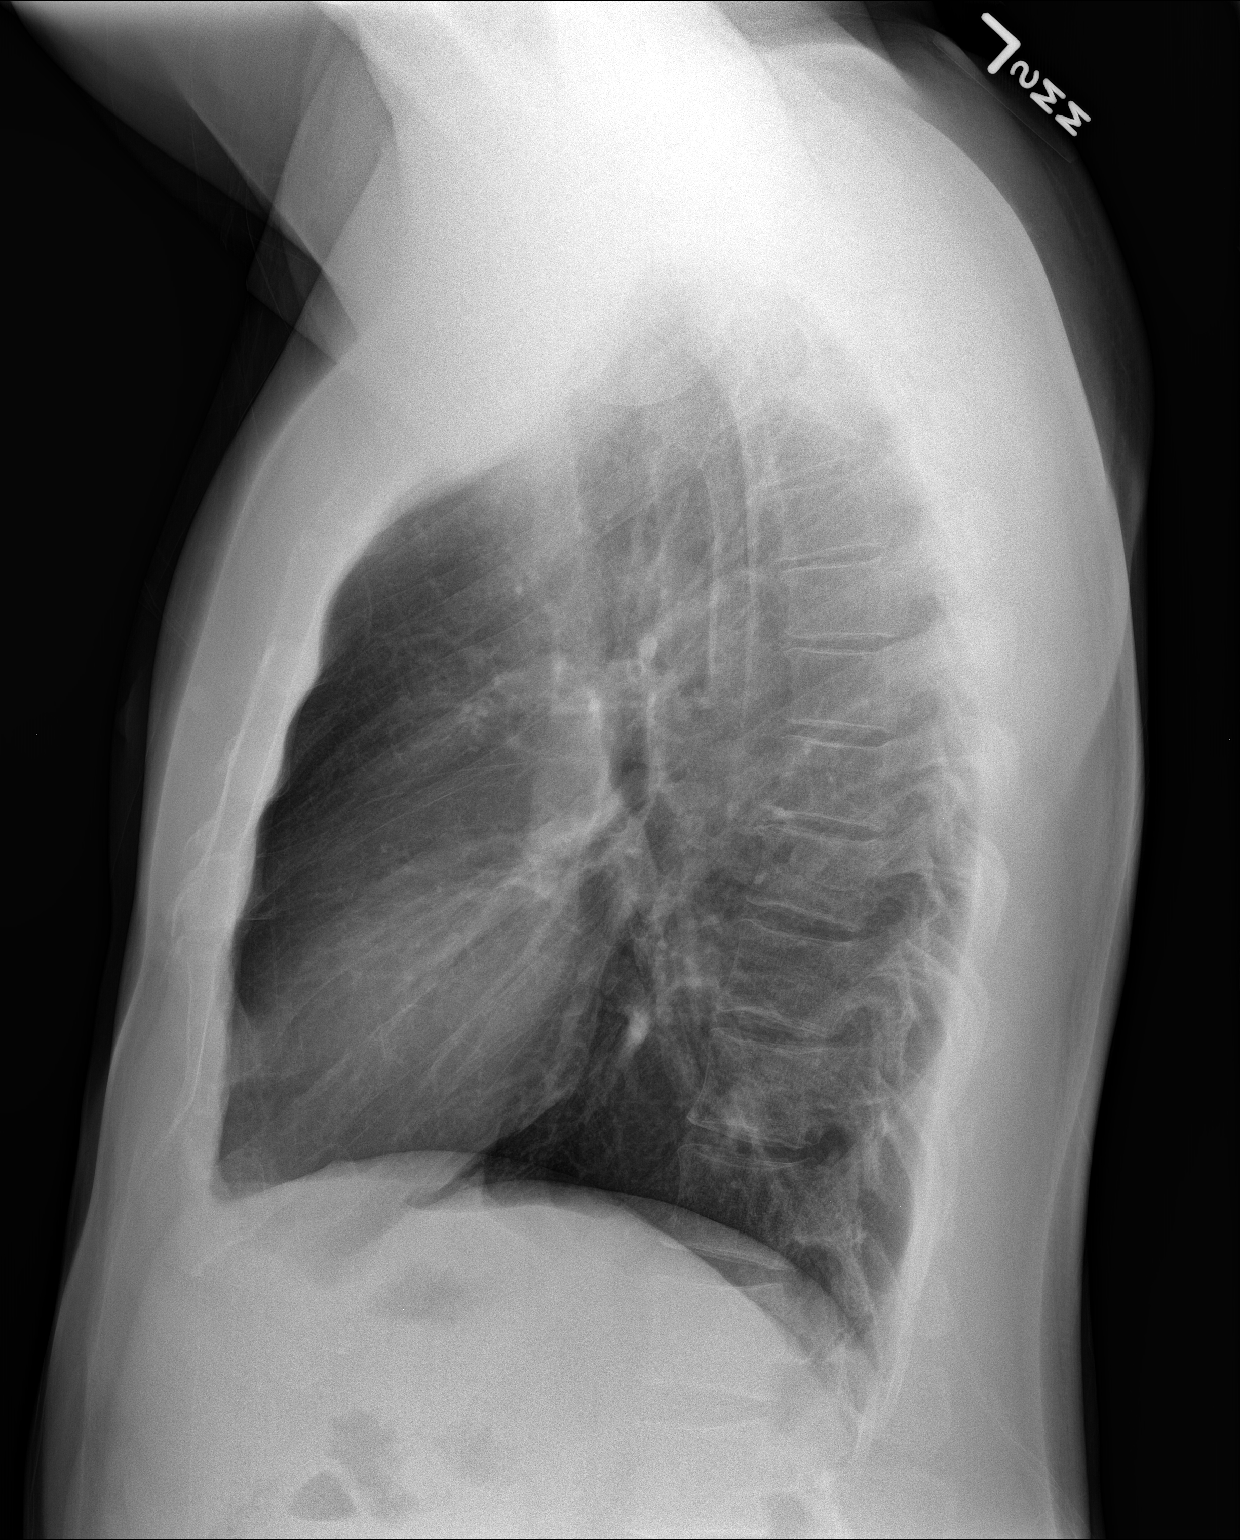

[2 of 2 positions shown; findings below may reference images not displayed]

FINDINGS: Normal sized heart. Clear lungs. Mild scoliosis. Minimal thoracic
spine degenerative changes.
IMPRESSION: No acute abnormality.

## 2019-01-26 ENCOUNTER — Ambulatory Visit (HOSPITAL_COMMUNITY)
Admission: EM | Admit: 2019-01-26 | Discharge: 2019-01-26 | Disposition: A | Discharge status: Home / Self Care | Payer: Self-pay | Attending: Physician Assistant | Admitting: Physician Assistant

## 2019-01-26 ENCOUNTER — Encounter (HOSPITAL_COMMUNITY): Payer: Self-pay

## 2019-01-26 ENCOUNTER — Other Ambulatory Visit: Payer: Self-pay

## 2019-01-26 DIAGNOSIS — Z113 Encounter for screening for infections with a predominantly sexual mode of transmission: Secondary | ICD-10-CM

## 2019-01-26 DIAGNOSIS — Z202 Contact with and (suspected) exposure to infections with a predominantly sexual mode of transmission: Secondary | ICD-10-CM

## 2019-01-26 DIAGNOSIS — Z7689 Persons encountering health services in other specified circumstances: Secondary | ICD-10-CM

## 2019-01-26 LAB — HIV ANTIBODY (ROUTINE TESTING W REFLEX): HIV Screen 4th Generation wRfx: NONREACTIVE

## 2019-01-26 NOTE — ED Triage Notes (Signed)
Patient presents to Urgent Care with complaints of a "funny feeling in my penis" since almost a week ago. Patient reports he thought it would go away but it is still there. Pt states he had sex w/ a condom but it broke.

## 2019-01-26 NOTE — Discharge Instructions (Addendum)
We have sent your lab work out and will notify you of results requiring treatment. Negative results will be visible in MyChart, there is information in this paperwork on how to set that up.   Drink plenty of fluids.  Always practice safe sex with a condom.

## 2019-01-26 NOTE — ED Provider Notes (Signed)
[facility]    CSN: [id number] Arrival date & time: 01/26/19  1607      History   Chief Complaint Chief Complaint  Patient presents with  . SEXUALLY TRANSMITTED DISEASE    HPI Lee Welch is a 54 y.o. male.   Patient reports to urgent care today for 1 week of "tingling" in his penis. He reports having sex 1 week ago and the condom broke. He noticed the tingling starting the next day. He has not had discharge or painful urination. No fevers or chills.  He states this was a "one night stand" and the STD status of the partner is unknown.   He has not had a history of sexually transmitted disease. He was tested and treated for discharge and dysuria in 2016 but results were negative.      History reviewed. No pertinent past medical history.  There are no problems to display for this patient.   History reviewed. No pertinent surgical history.     Home Medications    Prior to Admission medications   Medication Sig Start Date End Date Taking? Authorizing Provider  azithromycin (ZITHROMAX Z-PAK) 250 MG tablet Take 2 pills today, then 1 pill daily until gone. 04/15/15   Melony Overly, MD  predniSONE (DELTASONE) 50 MG tablet Take 1 pill daily for 5 days. 04/15/15   Melony Overly, MD  trimethoprim-polymyxin b (POLYTRIM) ophthalmic solution Place 1 drop into the left eye 4 (four) times daily. For 5 days 04/15/15   Melony Overly, MD    Family History Family History  Problem Relation Age of Onset  . Healthy Mother   . Healthy Father     Social History Social History   Tobacco Use  . Smoking status: Never Smoker  . Smokeless tobacco: Never Used  Substance Use Topics  . Alcohol use: No  . Drug use: No     Allergies   Patient has no known allergies.   Review of Systems Review of Systems  Constitutional: Negative for chills and fever.  Eyes: Negative for pain and visual disturbance.  Cardiovascular: Negative for chest pain.  Gastrointestinal:  Negative for abdominal pain and nausea.  Genitourinary: Negative for discharge, dysuria, flank pain, genital sores, hematuria, penile pain, penile swelling, testicular pain and urgency.  Musculoskeletal: Negative for arthralgias, back pain and myalgias.  Skin: Negative for color change and rash.  All other systems reviewed and are negative.    Physical Exam Triage Vital Signs ED Triage Vitals  Enc Vitals Group     BP      Pulse      Resp      Temp      Temp src      SpO2      Weight      Height      Head Circumference      Peak Flow      Pain Score      Pain Loc      Pain Edu?      Excl. in [place]?    No data found.  Updated Vital Signs BP 129/81 (BP Location: Right Arm)   Pulse 68   Temp 98.3 F (36.8 C) (Oral)   Resp 16   SpO2 100%   Visual Acuity Right Eye Distance:   Left Eye Distance:   Bilateral Distance:    Right Eye Near:   Left Eye Near:    Bilateral Near:     Physical Exam Vitals  and nursing note reviewed.  Constitutional:      General: He is not in acute distress.    Appearance: He is well-developed and normal weight. He is not ill-appearing.  HENT:     Head: Normocephalic and atraumatic.  Eyes:     General: No scleral icterus.    Conjunctiva/sclera: Conjunctivae normal.     Pupils: Pupils are equal, round, and reactive to light.  Cardiovascular:     Rate and Rhythm: Normal rate.  Pulmonary:     Effort: Pulmonary effort is normal. No respiratory distress.  Genitourinary:    Penis: Normal.      Comments: No lesion or discharge visible. Tolerated swab well Musculoskeletal:     Cervical back: Neck supple.  Skin:    General: Skin is warm and dry.  Neurological:     Mental Status: He is alert.  Psychiatric:        Mood and Affect: Mood normal.        Behavior: Behavior normal.        Thought Content: Thought content normal.        Judgment: Judgment normal.      UC Treatments / Results  Labs (all labs ordered are listed, but only  abnormal results are displayed) Labs Reviewed  HIV ANTIBODY (ROUTINE TESTING W REFLEX)  RPR  CYTOLOGY, (ORAL, ANAL, URETHRAL) ANCILLARY ONLY    EKG   Radiology No results found.  Procedures Procedures (including critical care time)  Medications Ordered in UC Medications - No data to display  Initial Impression / Assessment and Plan / UC Course  I have reviewed the triage vital signs and the nursing notes.  Pertinent labs & imaging results that were available during my care of the patient were reviewed by me and considered in my medical decision making (see chart for details).     #STD testing - tingling but no pain or discharge. Discussed that it is best to wait for results prior to treatment. Swab for CT/GC/Trich sent, HIV and RPR sent as well. Patient agrees   Final Clinical Impressions(s) / UC Diagnoses   Final diagnoses:  Encounter for assessment of STD exposure     Discharge Instructions     We have sent your lab work out and will notify you of results requiring treatment. Negative results will be visible in MyChart, there is information in this paperwork on how to set that up.   Drink plenty of fluids.  Always practice safe sex with a condom.     ED Prescriptions    None     PDMP not reviewed this encounter.   Hermelinda Medicus, PA-C 01/26/19 1821

## 2019-01-27 LAB — CYTOLOGY, (ORAL, ANAL, URETHRAL) ANCILLARY ONLY
Chlamydia: NEGATIVE
Neisseria Gonorrhea: NEGATIVE
Trichomonas: NEGATIVE

## 2019-01-27 LAB — RPR: RPR Ser Ql: NONREACTIVE

## 2021-02-05 ENCOUNTER — Other Ambulatory Visit: Payer: Self-pay

## 2021-02-05 ENCOUNTER — Ambulatory Visit (HOSPITAL_COMMUNITY)
Admission: EM | Admit: 2021-02-05 | Discharge: 2021-02-05 | Disposition: A | Discharge status: Home / Self Care | Payer: Self-pay | Attending: Medical Oncology | Admitting: Medical Oncology

## 2021-02-05 ENCOUNTER — Encounter (HOSPITAL_COMMUNITY): Payer: Self-pay | Admitting: Emergency Medicine

## 2021-02-05 DIAGNOSIS — G5603 Carpal tunnel syndrome, bilateral upper limbs: Secondary | ICD-10-CM

## 2021-02-05 MED ORDER — MELOXICAM 15 MG PO TABS: 15.0000 mg | ORAL_TABLET | Freq: Every day | ORAL | 0 refills | Status: DC

## 2021-02-05 NOTE — ED Provider Notes (Signed)
[facility]    CSN: [id number] Arrival date & time: 02/05/21  1505      History   Chief Complaint Chief Complaint  Patient presents with   Hand Problem    HPI Lee Welch is a 56 y.o. male.   HPI  Hand Pain: Patient reports that he has had bilateral hand tingling sensation for the past week.  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  Symptoms do seem to be worse at night.  He denies any significant swelling, lack of sensation, injury.  He has not tried anything for symptoms.  History reviewed. No pertinent past medical history.  There are no problems to display for this patient.   History reviewed. No pertinent surgical history.     Home Medications    Prior to Admission medications   Medication Sig Start Date End Date Taking? Authorizing Provider  azithromycin (ZITHROMAX Z-PAK) 250 MG tablet Take 2 pills today, then 1 pill daily until gone. 04/15/15   Charm Rings, MD  predniSONE (DELTASONE) 50 MG tablet Take 1 pill daily for 5 days. 04/15/15   Charm Rings, MD  trimethoprim-polymyxin b (POLYTRIM) ophthalmic solution Place 1 drop into the left eye 4 (four) times daily. For 5 days 04/15/15   Charm Rings, MD    Family History Family History  Problem Relation Age of Onset   Healthy Mother    Healthy Father     Social History Social History   Tobacco Use   Smoking status: Never   Smokeless tobacco: Never  Vaping Use   Vaping Use: Never used  Substance Use Topics   Alcohol use: No   Drug use: No     Allergies   Patient has no known allergies.   Review of Systems Review of Systems  As stated above in HPI Physical Exam Triage Vital Signs ED Triage Vitals  Enc Vitals Group     BP 02/05/21 1626 107/71     Pulse Rate 02/05/21 1626 62     Resp 02/05/21 1626 17     Temp 02/05/21 1626 98.1 F (36.7 C)     Temp Source  02/05/21 1626 Oral     SpO2 02/05/21 1626 97 %     Weight --      Height --      Head Circumference --      Peak Flow --      Pain Score 02/05/21 1624 4     Pain Loc --      Pain Edu? --      Excl. in [place]? --    No data found.  Updated Vital Signs BP 107/71 (BP Location: Right Arm)    Pulse 62    Temp 98.1 F (36.7 C) (Oral)    Resp 17    SpO2 97%   Physical Exam Vitals and nursing note reviewed.  Constitutional:      General: He is not in acute distress.    Appearance: Normal appearance. He is not ill-appearing, toxic-appearing or diaphoretic.  Cardiovascular:     Pulses: Normal pulses.  Musculoskeletal:        General: Deformity (Slight muscle waiting of thumb of the left hand) present. No swelling. Normal range of motion.     Comments: Positive Tinel and Phalen sign bilaterally but worse on the left side  Skin:    General: Skin is warm.     Capillary Refill: Capillary refill takes less than 2 seconds.  Neurological:     General: No focal deficit present.     Mental Status: He is alert and oriented to person, place, and time.     Motor: No weakness.     UC Treatments / Results  Labs (all labs ordered are listed, but only abnormal results are displayed) Labs Reviewed - No data to display  EKG   Radiology No results found.  Procedures Procedures (including critical care time)  Medications Ordered in UC Medications - No data to display  Initial Impression / Assessment and Plan / UC Course  I have reviewed the triage vital signs and the nursing notes.  Pertinent labs & imaging results that were available during my care of the patient were reviewed by me and considered in my medical decision making (see chart for details).     New appears to be carpal tunnel syndrome we discussed how this occurs along with treatment options. For now I have recommended NSAID trial, wrist braces and exercises. If unimproved I have recommended either PT or Orthopedics.  Final  Clinical Impressions(s) / UC Diagnoses   Final diagnoses:  None   Discharge Instructions   None    ED Prescriptions   None    PDMP not reviewed this encounter.   Rushie Chestnut, [place] 02/05/21 1653

## 2021-02-05 NOTE — ED Triage Notes (Signed)
Pt c/o hand pains worse at night, hand numbness for about week. 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

## 2021-09-17 ENCOUNTER — Ambulatory Visit (HOSPITAL_COMMUNITY)
Admission: EM | Admit: 2021-09-17 | Discharge: 2021-09-17 | Disposition: A | Discharge status: Home / Self Care | Payer: Self-pay | Attending: Physician Assistant | Admitting: Physician Assistant

## 2021-09-17 ENCOUNTER — Encounter (HOSPITAL_COMMUNITY): Payer: Self-pay | Admitting: Emergency Medicine

## 2021-09-17 DIAGNOSIS — M25512 Pain in left shoulder: Secondary | ICD-10-CM

## 2021-09-17 DIAGNOSIS — M545 Low back pain, unspecified: Secondary | ICD-10-CM

## 2021-09-17 DIAGNOSIS — J069 Acute upper respiratory infection, unspecified: Secondary | ICD-10-CM

## 2021-09-17 MED ORDER — NAPROXEN 500 MG PO TABS: 500.0000 mg | ORAL_TABLET | Freq: Two times a day (BID) | ORAL | 0 refills | Status: DC

## 2021-09-17 MED ORDER — FLUTICASONE PROPIONATE 50 MCG/ACT NA SUSP: 2.0000 | Freq: Every day | NASAL | 0 refills | Status: DC

## 2021-09-17 MED ORDER — PREDNISONE 10 MG PO TABS: 10.0000 mg | ORAL_TABLET | Freq: Three times a day (TID) | ORAL | 0 refills | Status: DC

## 2021-09-17 NOTE — ED Provider Notes (Signed)
[facility]    CSN: [id number] Arrival date & time: 09/17/21  1417      History   Chief Complaint Chief Complaint  Patient presents with   Back Pain   Shoulder Pain    HPI Lee Welch is a 56 y.o. male.   56 year old male presents with left shoulder pain, right lower back pain, nasal congestion.  Patient indicates for the past several weeks he has been having persistent nasal congestion and sinus congestion.  He relates his production has been clear.  He has not had any fever or chills, and has had minimal cough.  Dates that he has done 3 COVID test recently and they have all been negative.  He relates he has not been around any family or friends that have been sick. 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  He relates for the past couple weeks he has been having intermittent left shoulder pain and discomfort.  He relates the pain is worse when he tries to raise his hand above his head.  He relates most of the pain is in the anterior shoulder joint line.  He denies any numbness, tingling, or weakness.  He relates he has not injured the left shoulder.  He indicates he has taken some OTC Advil but it has not given him any relief. Patient also relates he is having right lower back pain that is worse when he turns, bends, and lifts.  He relates the pain is localized and does not travel.  He relates he has not having any numbness, tingling, weakness of the lower legs.  He relates he has not injured his back or had any trauma to the back.  He has taken some OTC medications for it along with the shoulder but the medications do not seem to help.   Back Pain Shoulder Pain Associated symptoms: back pain (lower) and neck pain (left shoulder)     History reviewed. No pertinent past medical history.  There are no problems to display for this patient.   History reviewed. No  pertinent surgical history.     Home Medications    Prior to Admission medications   Medication Sig Start Date End Date Taking? Authorizing Provider  fluticasone (FLONASE) 50 MCG/ACT nasal spray Place 2 sprays into both nostrils daily. 09/17/21  Yes Ellsworth Lennox, PA-C  naproxen (NAPROSYN) 500 MG tablet Take 1 tablet (500 mg total) by mouth 2 (two) times daily. 09/17/21  Yes Ellsworth Lennox, PA-C  predniSONE (DELTASONE) 10 MG tablet Take 1 tablet (10 mg total) by mouth in the morning, at noon, and at bedtime. 09/17/21  Yes Ellsworth Lennox, PA-C  azithromycin (ZITHROMAX Z-PAK) 250 MG tablet Take 2 pills today, then 1 pill daily until gone. 04/15/15   Charm Rings, MD  meloxicam (MOBIC) 15 MG tablet Take 1 tablet (15 mg total) by mouth daily. 02/05/21   Rushie Chestnut, PA-C  predniSONE (DELTASONE) 50 MG tablet Take 1 pill daily for 5 days. 04/15/15   Charm Rings, MD  trimethoprim-polymyxin b (POLYTRIM) ophthalmic solution Place 1 drop into the left eye 4 (four) times daily. For 5 days 04/15/15   Charm Rings, MD    Family History Family History  Problem Relation Age of Onset   Healthy Mother    Healthy Father     Social History Social History  Tobacco Use   Smoking status: Never   Smokeless tobacco: Never  Vaping Use   Vaping Use: Never used  Substance Use Topics   Alcohol use: No   Drug use: No     Allergies   Patient has no known allergies.   Review of Systems Review of Systems  Musculoskeletal:  Positive for back pain (lower) and neck pain (left shoulder).     Physical Exam Triage Vital Signs ED Triage Vitals  Enc Vitals Group     BP 09/17/21 1449 126/80     Pulse Rate 09/17/21 1449 60     Resp 09/17/21 1449 17     Temp 09/17/21 1449 98.7 F (37.1 C)     Temp Source 09/17/21 1449 Oral     SpO2 09/17/21 1449 96 %     Weight --      Height --      Head Circumference --      Peak Flow --      Pain Score 09/17/21 1448 7     Pain Loc --      Pain Edu? --       Excl. in GC? --    No data found.  Updated Vital Signs BP 126/80 (BP Location: Left Arm)   Pulse 60   Temp 98.7 F (37.1 C) (Oral)   Resp 17   SpO2 96%   Visual Acuity Right Eye Distance:   Left Eye Distance:   Bilateral Distance:    Right Eye Near:   Left Eye Near:    Bilateral Near:     Physical Exam Constitutional:      General: He is awake.     Appearance: Normal appearance.  HENT:     Right Ear: Tympanic membrane and ear canal normal.     Left Ear: Tympanic membrane and ear canal normal.     Mouth/Throat:     Mouth: Mucous membranes are moist.  Cardiovascular:     Rate and Rhythm: Normal rate and regular rhythm.     Heart sounds: Normal heart sounds.  Pulmonary:     Effort: Pulmonary effort is normal.     Breath sounds: Normal breath sounds and air entry. No wheezing, rhonchi or rales.  Musculoskeletal:       Arms:     Comments: Left shoulder: Pain is palpated along the anterior joint line without any swelling or redness.  Full range of motion is normal without crepitus.  No pain with resistance lateral and forward abduction.  Negative can and negative drop test.  Lower back: Pain is palpated along the L5-S1 area right paraspinous without any redness or swelling.  Range of motion is limited with pain reproduced on turning and bending.  Negative straight leg raise bilaterally, no pain with internal/external rotations.  Lymphadenopathy:     Cervical: No cervical adenopathy.  Neurological:     Mental Status: He is alert.      UC Treatments / Results  Labs (all labs ordered are listed, but only abnormal results are displayed) Labs Reviewed - No data to display  EKG   Radiology No results found.  Procedures Procedures (including critical care time)  Medications Ordered in UC Medications - No data to display  Initial Impression / Assessment and Plan / UC Course  I have reviewed the triage vital signs and the nursing notes.  Pertinent labs & imaging  results that were available during my care of the patient were reviewed by me and considered in  my medical decision making (see chart for details).       Plan: 1.  Advised to take the Naprosyn 500 mg every 12 hours with food to help reduce the back and shoulder pain. 2.  Advise use Flonase nasal spray 2 sprays each nostril daily to help reduce the sinus and nasal congestion. 3.  Advised take prednisone 10 mg 3 times a day for 5 days only to help reduce the inflammatory process. 4.  Advised follow-up PCP or return to urgent care if symptoms fail to improve. Final Clinical Impressions(s) / UC Diagnoses   Final diagnoses:  Acute right-sided low back pain without sciatica  Acute pain of left shoulder  Acute upper respiratory infection     Discharge Instructions      Advised the use of Flonase nasal spray 2 sprays each nostril once a day to help decrease sinus and nasal congestion. Advised take Naprosyn 500 mg every 12 hours with food on a regular basis for the next several days to help decrease the left shoulder and the lower back pain. Advised take prednisone 10 mg 3 times a day for 5 days only to help reduce the acute inflammatory process. Advised to follow-up with PCP or return to urgent care if symptoms fail to improve.    ED Prescriptions     Medication Sig Dispense Auth. Provider   predniSONE (DELTASONE) 10 MG tablet Take 1 tablet (10 mg total) by mouth in the morning, at noon, and at bedtime. 15 tablet Nyoka Lint, PA-C   naproxen (NAPROSYN) 500 MG tablet Take 1 tablet (500 mg total) by mouth 2 (two) times daily. 30 tablet Nyoka Lint, PA-C   fluticasone [facility]) 50 MCG/ACT nasal spray Place 2 sprays into both nostrils daily. 11.1 g Nyoka Lint, PA-C      PDMP not reviewed this encounter.   Nyoka Lint, PA-C 09/17/21 1530

## 2021-09-17 NOTE — ED Triage Notes (Signed)
Pt c/o left shoulder pain for 2 weeks. Right lower back pains for a few days. Tried tylenol extra strength two times so didn't take anymore. Denies falls or injuries.  Having nasal congestion for about 2 weeks, had negative covid test. Tried tylenol cold and flu and nyquil

## 2021-09-17 NOTE — Discharge Instructions (Signed)
Advised the use of Flonase nasal spray 2 sprays each nostril once a day to help decrease sinus and nasal congestion. Advised take Naprosyn 500 mg every 12 hours with food on a regular basis for the next several days to help decrease the left shoulder and the lower back pain. Advised take prednisone 10 mg 3 times a day for 5 days only to help reduce the acute inflammatory process. Advised to follow-up with PCP or return to urgent care if symptoms fail to improve.

## 2022-04-08 ENCOUNTER — Ambulatory Visit (HOSPITAL_COMMUNITY)
Admission: EM | Admit: 2022-04-08 | Discharge: 2022-04-08 | Disposition: A | Discharge status: Home / Self Care | Payer: Self-pay | Attending: Physician Assistant | Admitting: Physician Assistant

## 2022-04-08 ENCOUNTER — Encounter (HOSPITAL_COMMUNITY): Payer: Self-pay

## 2022-04-08 DIAGNOSIS — J069 Acute upper respiratory infection, unspecified: Secondary | ICD-10-CM

## 2022-04-08 DIAGNOSIS — J209 Acute bronchitis, unspecified: Secondary | ICD-10-CM

## 2022-04-08 MED ORDER — AZITHROMYCIN 250 MG PO TABS: ORAL_TABLET | ORAL | 0 refills | Status: DC

## 2022-04-08 MED ORDER — PROMETHAZINE-DM 6.25-15 MG/5ML PO SYRP: 5.0000 mL | ORAL_SOLUTION | Freq: Four times a day (QID) | ORAL | 0 refills | Status: DC | PRN

## 2022-04-08 NOTE — Discharge Instructions (Signed)
Advised take the Phenergan DM, 1 teaspoon every 6 hours to help reduce cough, congestion drainage. Advised take Zithromax 250 mg, 2 tablets today and then 1 tablet daily until completed to treat infection. Advised to continue to increase fluid intake with clear liquids. Advised take ibuprofen or Tylenol as needed for aches, pain and body discomfort.  Advised follow-up PCP return to urgent care as needed.

## 2022-04-08 NOTE — ED Triage Notes (Signed)
Pt is here for chest congestion, nasal congestion, runny nose, sneezing, headache chills, back pain , pt has been using aka seltzer cold , Thera flu x x 4days

## 2022-04-08 NOTE — ED Provider Notes (Signed)
[facility]    CSN: [id number] Arrival date & time: 04/08/22  1201      History   Chief Complaint Chief Complaint  Patient presents with   Cough    HPI Lee Welch is a 57 y.o. male.   57 year old male presents with cough and congestion.  Patient indicates for the past 5 days he has been having upper respiratory congestion with sinus, nasal, maxillary sinus congestion, postnasal drip and rhinitis which is mainly been purulent and thick.  Patient also indicates he has been having chest congestion with intermittent cough.  He indicates that he has been having coughing fits, spasms that keeps him up at night due to coughing so much.  He indicates he is not having any shortness of breath or wheezing.  He relates the production is purulent and green.  He has been taking OTC cough preparations but this has not been giving him relief from his symptoms.  He did indicate he did a COVID test yesterday which was negative.  Has not been around any friends, coworkers have been sick with similar symptoms.   Cough Associated symptoms: rhinorrhea     History reviewed. No pertinent past medical history.  There are no problems to display for this patient.   History reviewed. No pertinent surgical history.     Home Medications    Prior to Admission medications   Medication Sig Start Date End Date Taking? Authorizing Provider  azithromycin (ZITHROMAX Z-PAK) 250 MG tablet 2 tablets initially and then 1 tablet daily until completed. 04/08/22  Yes Ellsworth Lennox, PA-C  fluticasone [facility]) 50 MCG/ACT nasal spray Place 2 sprays into both nostrils daily. 09/17/21  Yes Ellsworth Lennox, PA-C  promethazine-dextromethorphan (PROMETHAZINE-DM) 6.25-15 MG/5ML syrup Take 5 mLs by mouth 4 (four) times daily as needed for cough. 04/08/22  Yes Ellsworth Lennox, PA-C  azithromycin (ZITHROMAX Z-PAK) 250 MG tablet Take 2 pills today, then 1 pill daily until gone. 04/15/15   Charm Rings, MD  meloxicam (MOBIC)  15 MG tablet Take 1 tablet (15 mg total) by mouth daily. 02/05/21   Rushie Chestnut, PA-C  naproxen (NAPROSYN) 500 MG tablet Take 1 tablet (500 mg total) by mouth 2 (two) times daily. 09/17/21   Ellsworth Lennox, PA-C  predniSONE (DELTASONE) 10 MG tablet Take 1 tablet (10 mg total) by mouth in the morning, at noon, and at bedtime. 09/17/21   Ellsworth Lennox, PA-C  predniSONE (DELTASONE) 50 MG tablet Take 1 pill daily for 5 days. 04/15/15   Charm Rings, MD  trimethoprim-polymyxin b (POLYTRIM) ophthalmic solution Place 1 drop into the left eye 4 (four) times daily. For 5 days 04/15/15   Charm Rings, MD    Family History Family History  Problem Relation Age of Onset   Healthy Mother    Healthy Father     Social History Social History   Tobacco Use   Smoking status: Never   Smokeless tobacco: Never  Vaping Use   Vaping Use: Never used  Substance Use Topics   Alcohol use: No   Drug use: No     Allergies   Patient has no known allergies.   Review of Systems Review of Systems  HENT:  Positive for postnasal drip, rhinorrhea and sinus pressure.   Respiratory:  Positive for cough.      Physical Exam Triage Vital Signs ED Triage Vitals  Enc Vitals Group     BP 04/08/22 1300 108/64     Pulse Rate 04/08/22  1300 61     Resp 04/08/22 1300 16     Temp 04/08/22 1300 98.5 F (36.9 C)     Temp Source 04/08/22 1300 Oral     SpO2 04/08/22 1300 96 %     Weight --      Height --      Head Circumference --      Peak Flow --      Pain Score 04/08/22 1257 6     Pain Loc --      Pain Edu? --      Excl. in GC? --    No data found.  Updated Vital Signs BP 108/64 (BP Location: Left Arm)   Pulse 61   Temp 98.5 F (36.9 C) (Oral)   Resp 16   SpO2 96%   Visual Acuity Right Eye Distance:   Left Eye Distance:   Bilateral Distance:    Right Eye Near:   Left Eye Near:    Bilateral Near:     Physical Exam Constitutional:      Appearance: Normal appearance.  HENT:     Right Ear:  Ear canal normal. Tympanic membrane is injected.     Left Ear: Ear canal normal. Tympanic membrane is injected.     Mouth/Throat:     Mouth: Mucous membranes are moist.     Pharynx: Oropharynx is clear. No posterior oropharyngeal erythema.  Cardiovascular:     Rate and Rhythm: Normal rate and regular rhythm.     Heart sounds: Normal heart sounds.  Pulmonary:     Effort: Pulmonary effort is normal.     Breath sounds: Normal breath sounds and air entry. No wheezing, rhonchi or rales.  Lymphadenopathy:     Cervical: No cervical adenopathy.  Neurological:     Mental Status: He is alert.      UC Treatments / Results  Labs (all labs ordered are listed, but only abnormal results are displayed) Labs Reviewed - No data to display  EKG   Radiology No results found.  Procedures Procedures (including critical care time)  Medications Ordered in UC Medications - No data to display  Initial Impression / Assessment and Plan / UC Course  I have reviewed the triage vital signs and the nursing notes.  Pertinent labs & imaging results that were available during my care of the patient were reviewed by me and considered in my medical decision making (see chart for details).    Plan: The diagnosis to be treated with the following: Upper respiratory tract infection: A.  Advised take Phenergan DM, 1 teaspoon every 6 hours for cough and congestion. 2.  Acute bronchitis: A.  Advised take Phenergan DM, 1 teaspoon every 6 hours for cough and congestion. B.  Advised take Zithromax 250 mg, 2 tablets daily 1 tablet daily to treat infection. 3.  Advised follow-up PCP return to urgent care if symptoms fail to improve. Final Clinical Impressions(s) / UC Diagnoses   Final diagnoses:  Viral upper respiratory tract infection  Acute bronchitis, unspecified organism     Discharge Instructions      Advised take the Phenergan DM, 1 teaspoon every 6 hours to help reduce cough, congestion  drainage. Advised take Zithromax 250 mg, 2 tablets today and then 1 tablet daily until completed to treat infection. Advised to continue to increase fluid intake with clear liquids. Advised take ibuprofen or Tylenol as needed for aches, pain and body discomfort.  Advised follow-up PCP return to urgent care as needed.  ED Prescriptions     Medication Sig Dispense Auth. Provider   azithromycin (ZITHROMAX Z-PAK) 250 MG tablet 2 tablets initially and then 1 tablet daily until completed. 6 each Ellsworth Lennox, PA-C   promethazine-dextromethorphan (PROMETHAZINE-DM) 6.25-15 MG/5ML syrup Take 5 mLs by mouth 4 (four) times daily as needed for cough. 118 mL Ellsworth Lennox, PA-C      PDMP not reviewed this encounter.   Ellsworth Lennox, PA-C 04/08/22 1321

## 2023-03-10 ENCOUNTER — Ambulatory Visit (HOSPITAL_COMMUNITY)
Admission: EM | Admit: 2023-03-10 | Discharge: 2023-03-10 | Disposition: A | Discharge status: Home / Self Care | Payer: Self-pay | Attending: Family Medicine | Admitting: Family Medicine

## 2023-03-10 ENCOUNTER — Encounter (HOSPITAL_COMMUNITY): Payer: Self-pay

## 2023-03-10 DIAGNOSIS — K120 Recurrent oral aphthae: Secondary | ICD-10-CM | POA: Insufficient documentation

## 2023-03-10 DIAGNOSIS — B070 Plantar wart: Secondary | ICD-10-CM | POA: Insufficient documentation

## 2023-03-10 LAB — BASIC METABOLIC PANEL
Anion gap: 12 (ref 5–15)
BUN: 10 mg/dL (ref 6–20)
CO2: 26 mmol/L (ref 22–32)
Calcium: 9.9 mg/dL (ref 8.9–10.3)
Chloride: 101 mmol/L (ref 98–111)
Creatinine, Ser: 0.97 mg/dL (ref 0.61–1.24)
GFR, Estimated: >60 mL/min (ref >60)
Glucose, Bld: 80 mg/dL (ref 70–99)
Potassium: 4 mmol/L (ref 3.5–5.1)
Sodium: 139 mmol/L (ref 135–145)

## 2023-03-10 LAB — CBC
HCT: 46.6 % (ref 39.0–52.0)
Hemoglobin: 16.3 g/dL (ref 13.0–17.0)
MCH: 32.1 pg (ref 26.0–34.0)
MCHC: 35 g/dL (ref 30.0–36.0)
MCV: 91.9 fL (ref 80.0–100.0)
Platelets: 190 10*3/uL (ref 150–400)
RBC: 5.07 MIL/uL (ref 4.22–5.81)
RDW: 12.4 % (ref 11.5–15.5)
WBC: 3.1 10*3/uL — ABNORMAL LOW (ref 4.0–10.5)
nRBC: 0 % (ref 0.0–0.2)

## 2023-03-10 LAB — VITAMIN B12: Vitamin B-12: 972 pg/mL — ABNORMAL HIGH (ref 180–914)

## 2023-03-10 NOTE — Discharge Instructions (Signed)
 Staff will notify you if the swab is positive or if there is anything abnormal on your labs that we drew to check your blood counts, vitamin B12 level, or electrolytes.  For your plantar wart please get plantar wart remover patches.  Usually the pharmacy or store will have their store brand or there is Dr. Margart Sickles, Compound W, or Duofilm.  Use these according to the package instructions and use of that is adequate.

## 2023-03-10 NOTE — ED Triage Notes (Signed)
 Patient states he tongue feels cut. This started last night when he was brushing his teeth, Patient states left foot pain with a calluses on the bottom of his foot.

## 2023-03-10 NOTE — ED Provider Notes (Signed)
 [facility]    CSN: [id number] Arrival date & time: 03/10/23  1431      History   Chief Complaint Chief Complaint  Patient presents with   Facial Laceration   Foot Pain    HPI Lee Welch is a 58 y.o. male.    Foot Pain  Here for pain on his left heel.  He because of the callus is formed there.  He also notes sores of his tongue with cracks and grooves.  He states his tongue was sore last night when he brushed his teeth.  This has happened off and on for a while.  No recent fever or congestion  NKDA    History reviewed. No pertinent past medical history.  There are no active problems to display for this patient.   History reviewed. No pertinent surgical history.     Home Medications    Prior to Admission medications   Not on File    Family History Family History  Problem Relation Age of Onset   Healthy Mother    Healthy Father     Social History Social History   Tobacco Use   Smoking status: Never   Smokeless tobacco: Never  Vaping Use   Vaping status: Never Used  Substance Use Topics   Alcohol use: No   Drug use: No     Allergies   Patient has no known allergies.   Review of Systems Review of Systems   Physical Exam Triage Vital Signs ED Triage Vitals  Encounter Vitals Group     BP 03/10/23 1600 (!) 147/93     Systolic BP Percentile --      Diastolic BP Percentile --      Pulse Rate 03/10/23 1600 60     Resp 03/10/23 1600 16     Temp 03/10/23 1600 97.7 F (36.5 C)     Temp Source 03/10/23 1600 Oral     SpO2 03/10/23 1600 98 %     Weight --      Height --      Head Circumference --      Peak Flow --      Pain Score 03/10/23 1601 5     Pain Loc --      Pain Education --      Exclude from Growth Chart --    No data found.  Updated Vital Signs BP (!) 147/93 (BP Location: Left Arm)   Pulse 60   Temp 97.7 F (36.5 C) (Oral)   Resp 16   SpO2 98%   Visual Acuity Right Eye Distance:   Left Eye  Distance:   Bilateral Distance:    Right Eye Near:   Left Eye Near:    Bilateral Near:     Physical Exam Vitals reviewed.  Constitutional:      General: He is not in acute distress.    Appearance: He is not ill-appearing, toxic-appearing or diaphoretic.  HENT:     Nose: Nose normal.     Mouth/Throat:     Comments: Mucous membranes are moist and pink.  There are grooves and ridges on his tongue on the entire surface and edges.  He does have 3 areas on his tongue that appear to be small ulcerations about 5 mm in diameter.  They are not bleeding and there is no drainage. Skin:    Coloration: Skin is not pale.  Neurological:     General: No focal deficit present.     Mental  Status: He is alert and oriented to person, place, and time.  Psychiatric:        Behavior: Behavior normal.      UC Treatments / Results  Labs (all labs ordered are listed, but only abnormal results are displayed) Labs Reviewed  HSV CULTURE AND TYPING  CBC  BASIC METABOLIC PANEL  VITAMIN B12    EKG   Radiology No results found.  Procedures Procedures (including critical care time)  Medications Ordered in UC Medications - No data to display  Initial Impression / Assessment and Plan / UC Course  I have reviewed the triage vital signs and the nursing notes.  Pertinent labs & imaging results that were available during my care of the patient were reviewed by me and considered in my medical decision making (see chart for details).   Herpes swab is done of the ulcerations, though I doubt that is the explanation for the ulcerations.  Lab is drawn to see if he has any vitamin deficits that might explain the tongue abnormality.  It sounds like he has had the ridges and grooves a long time and possibly lifetime.  Request is made to help him find a PCP Final Clinical Impressions(s) / UC Diagnoses   Final diagnoses:  Aphthous ulcer of tongue  Plantar wart     Discharge Instructions      Staff  will notify you if the swab is positive or if there is anything abnormal on your labs that we drew to check your blood counts, vitamin B12 level, or electrolytes.  For your plantar wart please get plantar wart remover patches.  Usually the pharmacy or store will have their store brand or there is Dr. Margart Sickles, Compound W, or Duofilm.  Use these according to the package instructions and use of that is adequate.    ED Prescriptions   None    PDMP not reviewed this encounter.   Zenia Resides, MD 03/10/23 [phone]

## 2023-03-13 LAB — HSV CULTURE AND TYPING
